# Patient Record
Sex: Female | Born: 1985 | Race: White | Hispanic: No | Marital: Single | Smoking: Never smoker
Health system: Southern US, Community
[De-identification: ages and names within clinical notes are randomized; demographics above are authoritative.]

---

## 2015-04-21 ENCOUNTER — Emergency Department (INDEPENDENT_AMBULATORY_CARE_PROVIDER_SITE_OTHER): Payer: Managed Care, Other (non HMO)

## 2015-04-21 ENCOUNTER — Emergency Department (INDEPENDENT_AMBULATORY_CARE_PROVIDER_SITE_OTHER)
Admission: EM | Admit: 2015-04-21 | Discharge: 2015-04-21 | Disposition: A | Discharge status: Home / Self Care | Payer: Managed Care, Other (non HMO) | Source: Home / Self Care | Attending: Family Medicine | Admitting: Family Medicine

## 2015-04-21 ENCOUNTER — Encounter: Payer: Self-pay | Admitting: Emergency Medicine

## 2015-04-21 DIAGNOSIS — J069 Acute upper respiratory infection, unspecified: Secondary | ICD-10-CM

## 2015-04-21 DIAGNOSIS — K219 Gastro-esophageal reflux disease without esophagitis: Secondary | ICD-10-CM | POA: Diagnosis not present

## 2015-04-21 DIAGNOSIS — H6591 Unspecified nonsuppurative otitis media, right ear: Secondary | ICD-10-CM

## 2015-04-21 DIAGNOSIS — R05 Cough: Secondary | ICD-10-CM | POA: Diagnosis not present

## 2015-04-21 DIAGNOSIS — B9789 Other viral agents as the cause of diseases classified elsewhere: Principal | ICD-10-CM

## 2015-04-21 MED ORDER — OMEPRAZOLE 20 MG PO CPDR: DELAYED_RELEASE_CAPSULE | ORAL | Status: AC

## 2015-04-21 MED ORDER — AZITHROMYCIN 250 MG PO TABS: ORAL_TABLET | ORAL | Status: AC

## 2015-04-21 MED ORDER — PREDNISONE 20 MG PO TABS: 20.0000 mg | ORAL_TABLET | Freq: Two times a day (BID) | ORAL | Status: AC

## 2015-04-21 MED ORDER — GUAIFENESIN-CODEINE 100-10 MG/5ML PO SOLN: ORAL | Status: AC

## 2015-04-21 NOTE — ED Provider Notes (Signed)
CSN: [id number]     Arrival date & time 04/21/15  0925 History   First MD Initiated Contact with Patient 04/21/15 0945     Chief Complaint  Patient presents with  . Cough  . Nasal Congestion      HPI Comments: Patient complains of approximately 8 month history of recurring mild cough, usually worse in the morning upon awakening when she must clear phlegm from her throat.  She notes that she has intermittent heartburn. Six days ago patient developed typical cold-like symptoms including mild sore throat, sinus congestion, headache, fatigue, and worsening cough.  No fevers, chills, and sweats.  Her ears have become clogged and she is unable to hear much in her right ear. There is a family history of Barrett's esophagitis in her brother.  The history is provided by the patient.    History reviewed. No pertinent past medical history. History reviewed. No pertinent past surgical history. Family History  Problem Relation Age of Onset  . Heart failure Father    Social History  Substance Use Topics  . Smoking status: Never Smoker   . Smokeless tobacco: None  . Alcohol Use: No   OB History    No data available     Review of Systems + sore throat + cough No pleuritic pain No wheezing + nasal congestion + post-nasal drainage No sinus pain/pressure No itchy/red eyes ? Earache; right ear feels clogged No hemoptysis No SOB No fever/chills No nausea No vomiting No abdominal pain No diarrhea No urinary symptoms No skin rash + fatigue No myalgias + headache Used OTC meds without relief  Allergies  Penicillins  Home Medications   Prior to Admission medications   Medication Sig Start Date End Date Taking? Authorizing Provider  azithromycin (ZITHROMAX Z-PAK) 250 MG tablet Take 2 tabs today; then begin one tab once daily for 4 more days. 04/21/15   Lattie Haw, MD  guaiFENesin-codeine 100-10 MG/5ML syrup Take 10mL by mouth at bedtime as needed for cough 04/21/15   Lattie Haw, MD  omeprazole (PRILOSEC) 20 MG capsule Take one cap by mouth 30 minutes before evening meal 04/21/15   Lattie Haw, MD  predniSONE (DELTASONE) 20 MG tablet Take 1 tablet (20 mg total) by mouth 2 (two) times daily. Take with food. 04/21/15   Lattie Haw, MD   BP 155/84 mmHg  Pulse 70  Temp(Src) 98.7 F (37.1 C) (Oral)  Wt 239 lb (108.41 kg)  SpO2 99%  LMP 04/06/2015 Physical Exam Nursing notes and Vital Signs reviewed. Appearance:  Patient appears stated age, and in no acute distress.  Patient is obese. Eyes:  Pupils are equal, round, and reactive to light and accomodation.  Extraocular movement is intact.  Conjunctivae are not inflamed  Ears:  Canals normal.  Left tympanic membrane appears normal but is slightly retracted.  Right tympanic membrane has serous effusion with air/fluid level. Nose:  Mildly congested turbinates.  No sinus tenderness.   Pharynx:  Normal Neck:  Supple.   Enlarged but nontender posterior nodes are palpated bilaterally  Lungs:  Clear to auscultation.  Breath sounds are equal.  Moving air well. Heart:  Regular rate and rhythm without murmurs, rubs, or gallops.  Abdomen:  Nontender without masses or hepatosplenomegaly.  Bowel sounds are present.  No CVA or flank tenderness.  Extremities:  No edema.  No calf tenderness Skin:  No rash present.   ED Course  Procedures     Tympanogram:  Left ear negative  peak pressure; Right ear:  Low peak height ("flat line")  Imaging Review Dg Chest 2 View  04/21/2015   CLINICAL DATA:  Cough for several months which has worsened 5 days ago with head and nasal congestion.  EXAM: CHEST  2 VIEW  COMPARISON:  None.  FINDINGS: The heart size and mediastinal contours are within normal limits. Both lungs are clear. The visualized skeletal structures are unremarkable.  IMPRESSION: No active cardiopulmonary disease.   Electronically Signed   By: Bary Richard M.D.   On: 04/21/2015 10:48     MDM   1. Viral URI with cough    2. Right serous otitis media, recurrence not specified, unspecified chronicity   3. Gastroesophageal reflux disease, esophagitis presence not specified    Begin Prilosec  daily. Discussed reflux precautions. Followup with PCP in one month.  Begin Z-pack and prednisone burst.  Rx for Robitussin AC for night time cough.  Take plain guaifenesin (  extended release tabs such as Mucinex) twice daily, with plenty of water, for cough and congestion.  May add Pseudoephedrine ( , one or two every 4 to 6 hours) for sinus congestion.  Get adequate rest.   May use Afrin nasal spray (or generic oxymetazoline) twice daily for about 5 days and then discontinue.  Also recommend using saline nasal spray several times daily and saline nasal irrigation (AYR is a common brand).  Try warm salt water gargles for sore throat.  Stop all antihistamines for now, and other non-prescription cough/cold preparations. Follow-up with family doctor if not improving about one week.    Lattie Haw, MD 04/21/15 1122

## 2015-04-21 NOTE — Discharge Instructions (Signed)
Take plain guaifenesin (  extended release tabs such as Mucinex) twice daily, with plenty of water, for cough and congestion.  May add Pseudoephedrine ( , one or two every 4 to 6 hours) for sinus congestion.  Get adequate rest.   May use Afrin nasal spray (or generic oxymetazoline) twice daily for about 5 days and then discontinue.  Also recommend using saline nasal spray several times daily and saline nasal irrigation (AYR is a common brand).  Try warm salt water gargles for sore throat.  Stop all antihistamines for now, and other non-prescription cough/cold preparations. Follow-up with family doctor if not improving about one week.   Gastroesophageal Reflux Disease, Adult Gastroesophageal reflux disease (GERD) happens when acid from your stomach flows up into the esophagus. When acid comes in contact with the esophagus, the acid causes soreness (inflammation) in the esophagus. Over time, GERD may create small holes (ulcers) in the lining of the esophagus. CAUSES   Increased body weight. This puts pressure on the stomach, making acid rise from the stomach into the esophagus.  Smoking. This increases acid production in the stomach.  Drinking alcohol. This causes decreased pressure in the lower esophageal sphincter (valve or ring of muscle between the esophagus and stomach), allowing acid from the stomach into the esophagus.  Late evening meals and a full stomach. This increases pressure and acid production in the stomach.  A malformed lower esophageal sphincter. Sometimes, no cause is found. SYMPTOMS   Burning pain in the lower part of the mid-chest behind the breastbone and in the mid-stomach area. This may occur twice a week or more often.  Trouble swallowing.  Sore throat.  Dry cough.  Asthma-like symptoms including chest tightness, shortness of breath, or wheezing. DIAGNOSIS  Your caregiver may be able to diagnose GERD based on your symptoms. In some cases, X-rays and  other tests may be done to check for complications or to check the condition of your stomach and esophagus. TREATMENT  Your caregiver may recommend over-the-counter or prescription medicines to help decrease acid production. Ask your caregiver before starting or adding any new medicines.  HOME CARE INSTRUCTIONS   Change the factors that you can control. Ask your caregiver for guidance concerning weight loss, quitting smoking, and alcohol consumption.  Avoid foods and drinks that make your symptoms worse, such as:  Caffeine or alcoholic drinks.  Chocolate.  Peppermint or mint flavorings.  Garlic and onions.  Spicy foods.  Citrus fruits, such as oranges, lemons, or limes.  Tomato-based foods such as sauce, chili, salsa, and pizza.  Fried and fatty foods.  Avoid lying down for the 3 hours prior to your bedtime or prior to taking a nap.  Eat small, frequent meals instead of large meals.  Wear loose-fitting clothing. Do not wear anything tight around your waist that causes pressure on your stomach.  Raise the head of your bed 6 to 8 inches with wood blocks to help you sleep. Extra pillows will not help.  Only take over-the-counter or prescription medicines for pain, discomfort, or fever as directed by your caregiver.  Do not take aspirin, ibuprofen, or other nonsteroidal anti-inflammatory drugs (NSAIDs). SEEK IMMEDIATE MEDICAL CARE IF:   You have pain in your arms, neck, jaw, teeth, or back.  Your pain increases or changes in intensity or duration.  You develop nausea, vomiting, or sweating (diaphoresis).  You develop shortness of breath, or you faint.  Your vomit is green, yellow, black, or looks like coffee grounds or blood.  Your stool  is red, bloody, or black. These symptoms could be signs of other problems, such as heart disease, gastric bleeding, or esophageal bleeding. MAKE SURE YOU:   Understand these instructions.  Will watch your condition.  Will get help  right away if you are not doing well or get worse. Document Released: 05/28/2005 Document Revised: 11/10/2011 Document Reviewed: 03/07/2011 [facility] Patient Information 2015 [place], [place]. This information is not intended to replace advice given to you by your health care provider. Make sure you discuss any questions you have with your health care provider.

## 2015-04-21 NOTE — ED Notes (Signed)
Pt c/o cough that is non productive, right ear pain and pressure and nasal congestion x1 week. States she has been afebrile.

## 2017-01-22 IMAGING — CR DG CHEST 2V
2 series · 2 of 2 positions shown · non-contrast
Comparison: None.

CLINICAL DATA: Cough for several months which has worsened 5 days
ago with head and nasal congestion.

EXAM:
CHEST  2 VIEW

[chest pa]
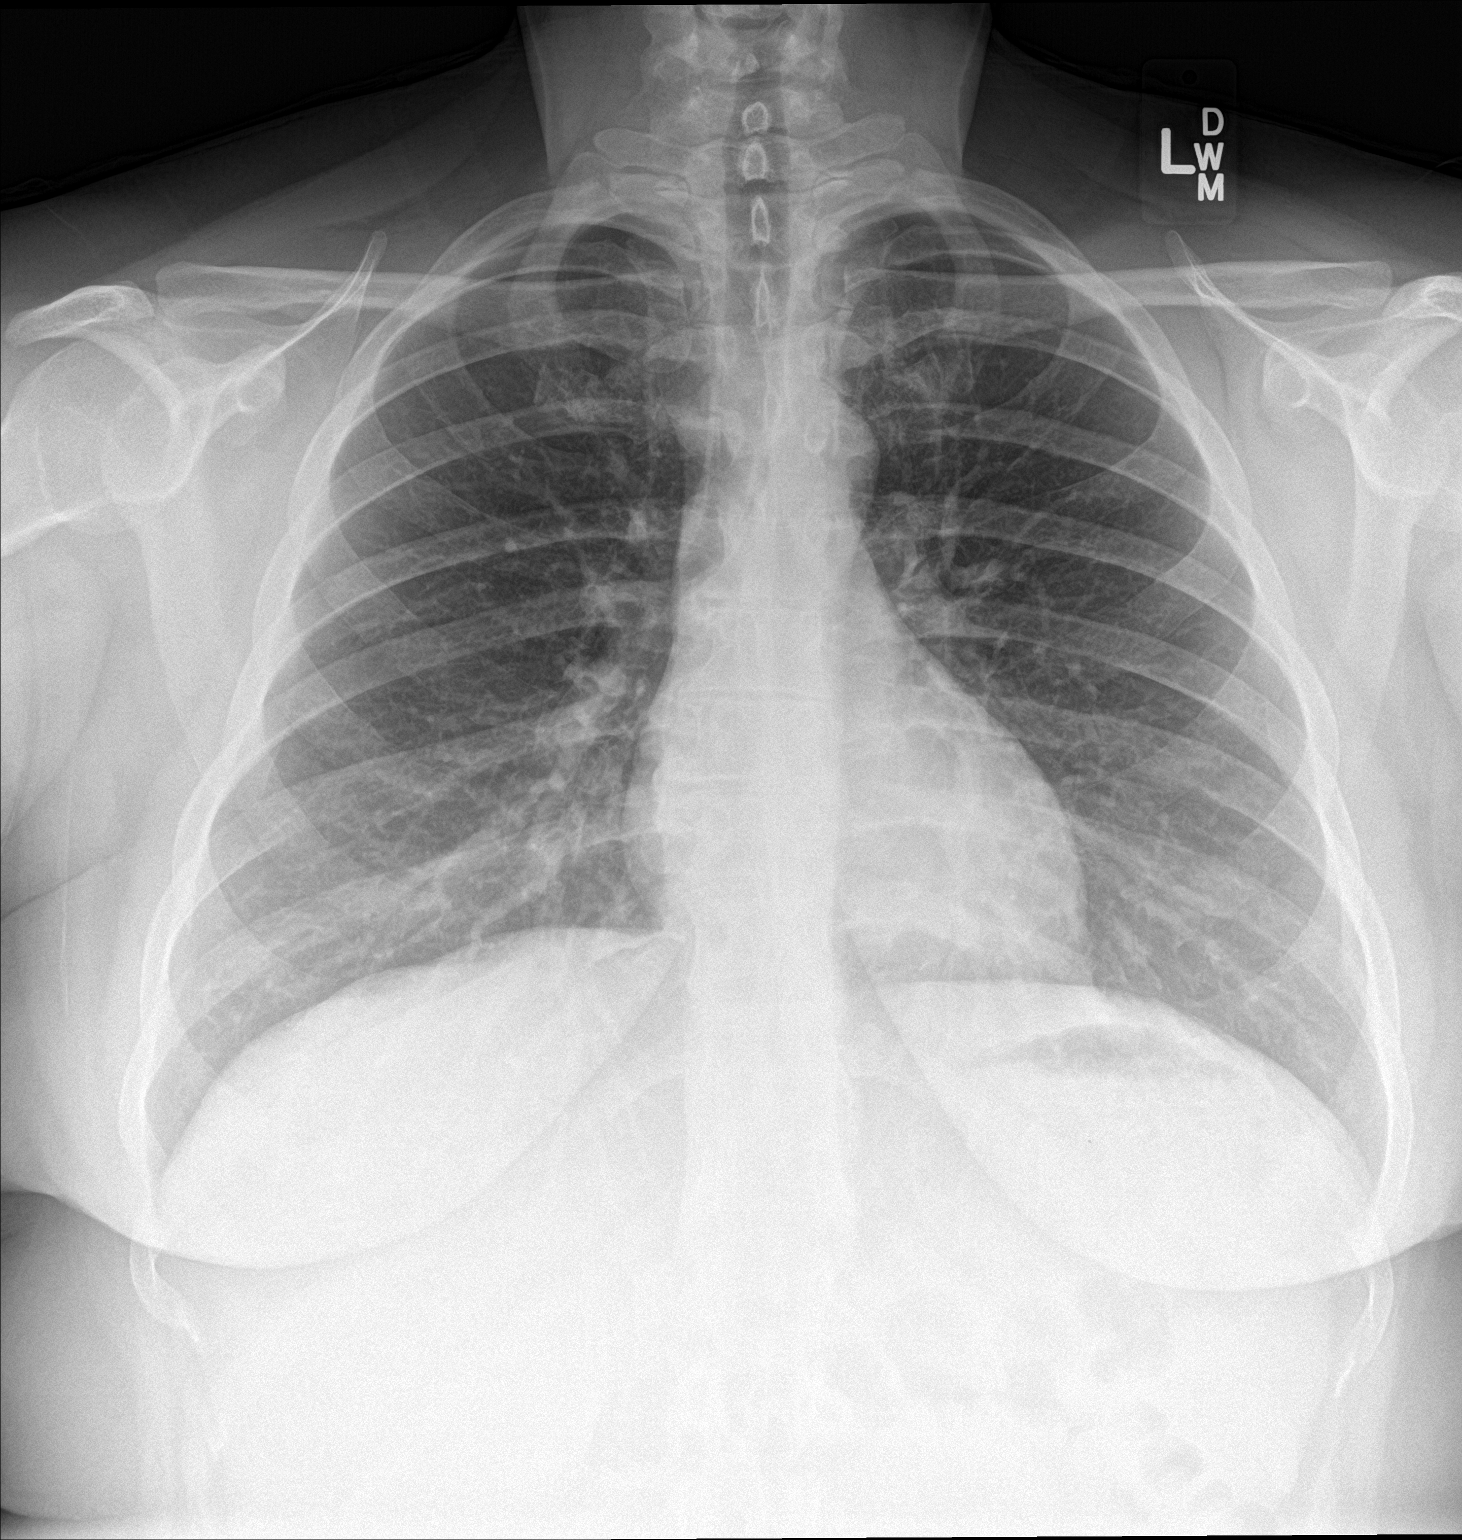

[chest lat]
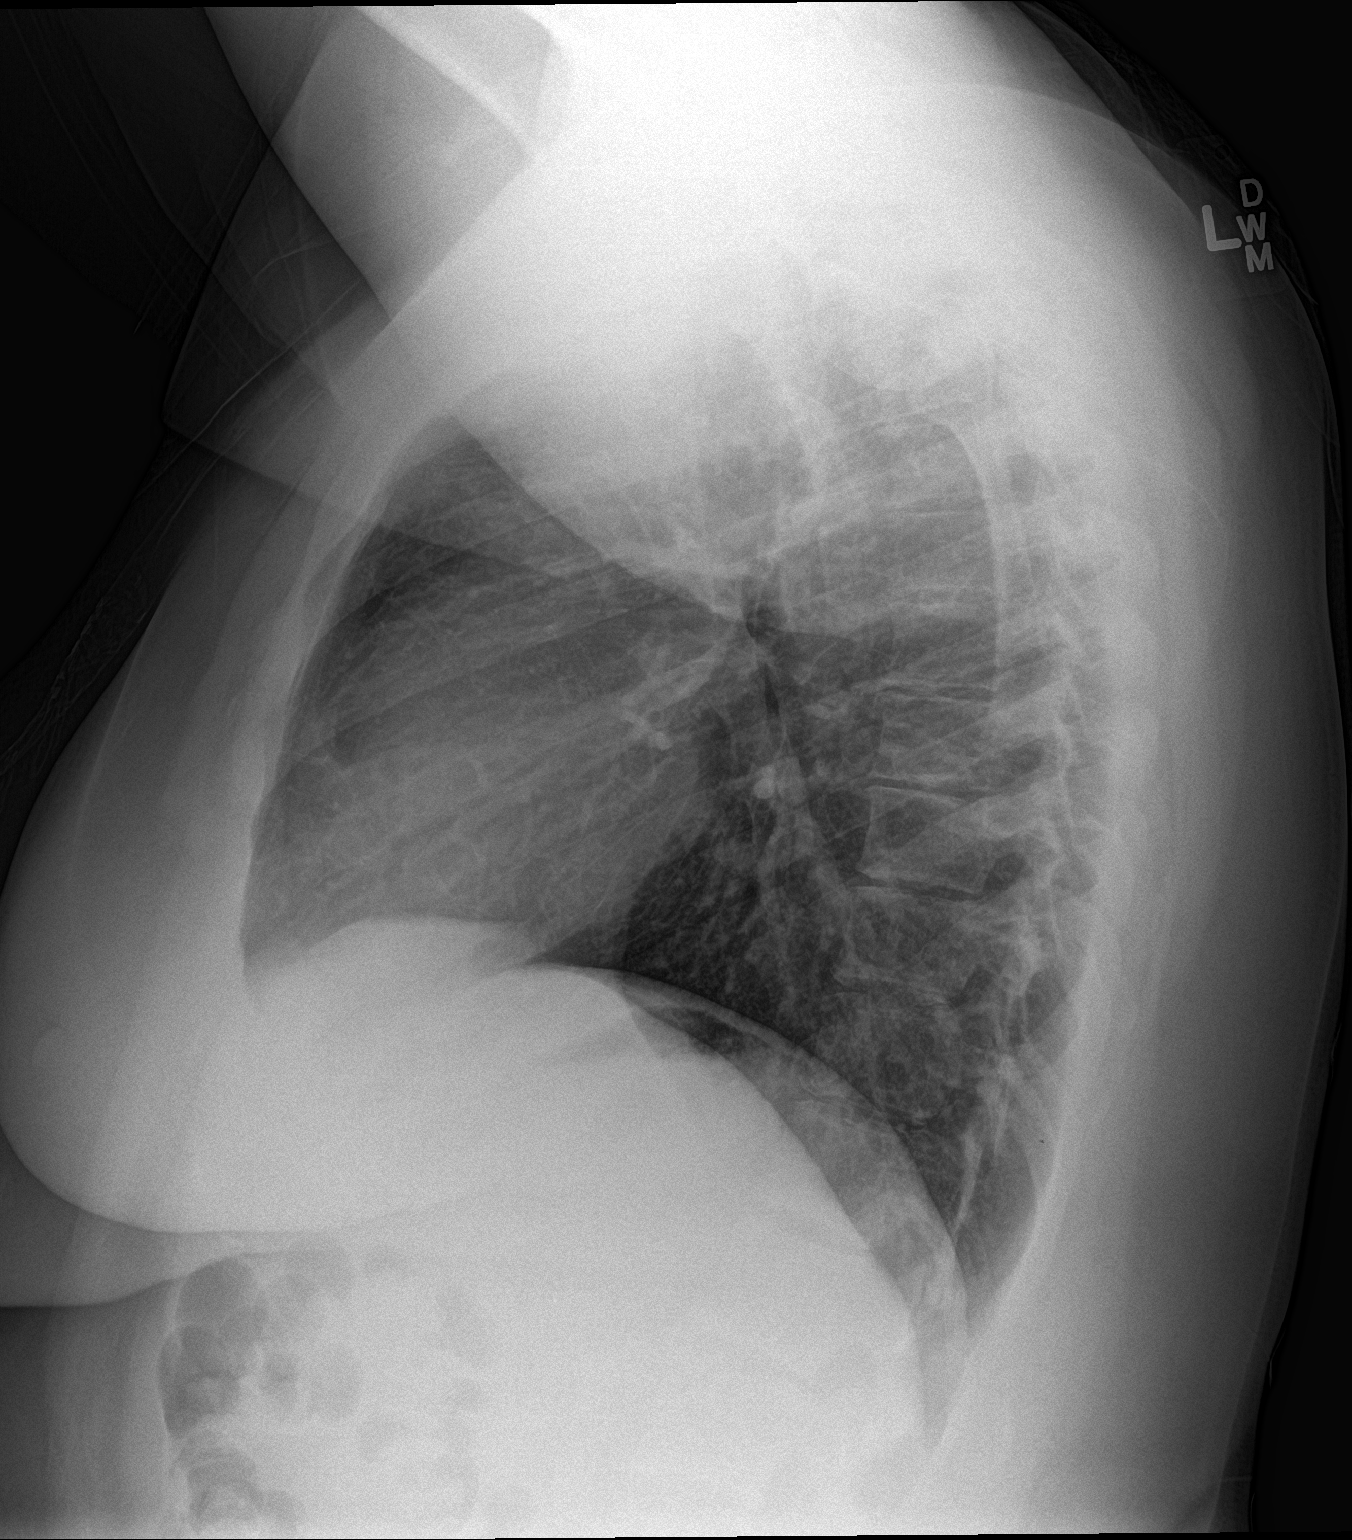

[2 of 2 positions shown; findings below may reference images not displayed]

FINDINGS: The heart size and mediastinal contours are within normal limits.
Both lungs are clear. The visualized skeletal structures are
unremarkable.
IMPRESSION: No active cardiopulmonary disease.
